# Patient Record
Sex: Male | Born: 1970 | Race: White | Hispanic: No | Marital: Married | Smoking: Current every day smoker
Health system: Southern US, Community
[De-identification: ages and names within clinical notes are randomized; demographics above are authoritative.]

## PROBLEM LIST (undated history)

## (undated) DIAGNOSIS — E109 Type 1 diabetes mellitus without complications: Secondary | ICD-10-CM

## (undated) DIAGNOSIS — R519 Headache, unspecified: Secondary | ICD-10-CM

## (undated) DIAGNOSIS — M25531 Pain in right wrist: Secondary | ICD-10-CM

## (undated) DIAGNOSIS — I10 Essential (primary) hypertension: Secondary | ICD-10-CM

## (undated) DIAGNOSIS — E785 Hyperlipidemia, unspecified: Secondary | ICD-10-CM

## (undated) DIAGNOSIS — M25511 Pain in right shoulder: Secondary | ICD-10-CM

## (undated) HISTORY — DX: Essential (primary) hypertension: I10

## (undated) HISTORY — PX: SHOULDER ARTHROSCOPY W/ ROTATOR CUFF REPAIR: SHX2400

## (undated) HISTORY — DX: Type 1 diabetes mellitus without complications: E10.9

## (undated) HISTORY — DX: Pain in right shoulder: M25.511

## (undated) HISTORY — DX: Pain in right wrist: M25.531

## (undated) HISTORY — DX: Headache, unspecified: R51.9

## (undated) HISTORY — DX: Hyperlipidemia, unspecified: E78.5

---

## 1997-08-22 ENCOUNTER — Emergency Department (HOSPITAL_COMMUNITY): Admission: EM | Admit: 1997-08-22 | Discharge: 1997-08-22 | Payer: Self-pay | Admitting: Emergency Medicine

## 2003-05-02 ENCOUNTER — Emergency Department (HOSPITAL_COMMUNITY): Admission: EM | Admit: 2003-05-02 | Discharge: 2003-05-02 | Payer: Self-pay | Admitting: Emergency Medicine

## 2005-05-11 IMAGING — CT CT ABDOMEN W/O CM
1 series · 16 of 32 positions shown, 20 images · IV contrast (agent unspecified)
Comparison: none

CLINICAL DATA: Right-sided flank pain.
 CT ABDOMEN WITHOUT CONTRAST AND CT PELVIS WITHOUT CONTRAST  - 05/02/03
 No prior studies for comparison.
 No IV or oral contrast was administered.
 CT ABDOMEN WITHOUT CONTRAST:
 Unenhanced appearance of the liver shows diffuse fatty infiltration.  The kidneys have a normal appearance bilaterally without hydronephrosis, renal calculi, or obvious contour abnormality on the unenhanced scan.  The ureters are normal.
 No other abnormality is identified.
 IMPRESSION
 Normal CT of the abdomen without contrast.  No acute findings.  Specifically no evidence of right-sided renal obstruction or calculi.  Incidental fatty infiltration of the liver.
 CT PELVIS WITHOUT CONTRAST:
 The ureters are normal without calculi.  The bladder is unremarkable.  There is a normal unenhanced appearance of the appendix.  No free fluid.
 Normal unenhanced CT of the pelvis.

[Series 3: — · axial · 0.79mm/px · z∈[+985,+1365]mm · 16 of 106 slices shown, 20 images]
[im 7/106  soft-tissue]
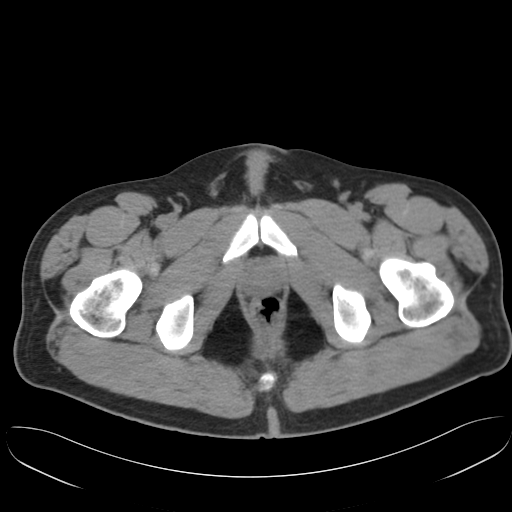
[im 7/106  bone]
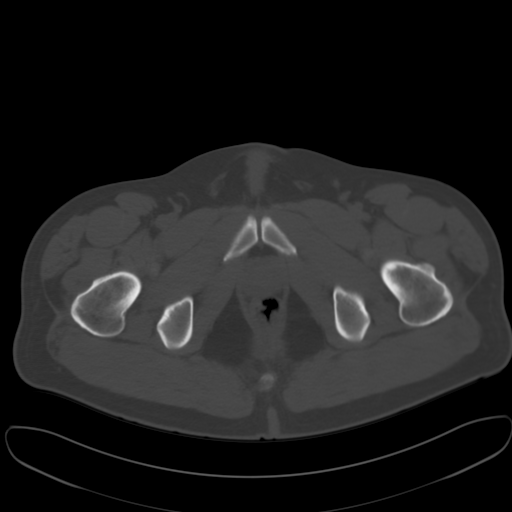
[im 14/106  soft-tissue]
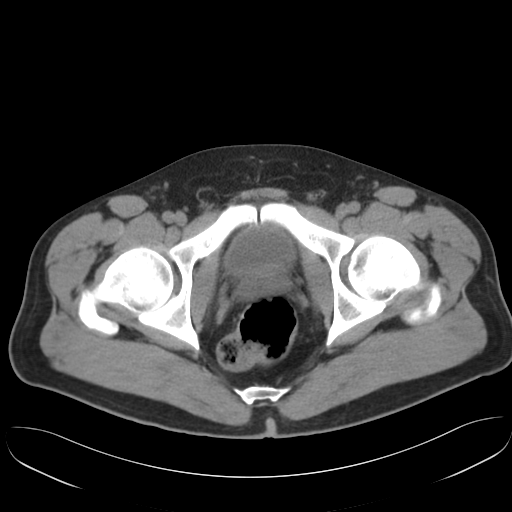
[im 21/106  soft-tissue]
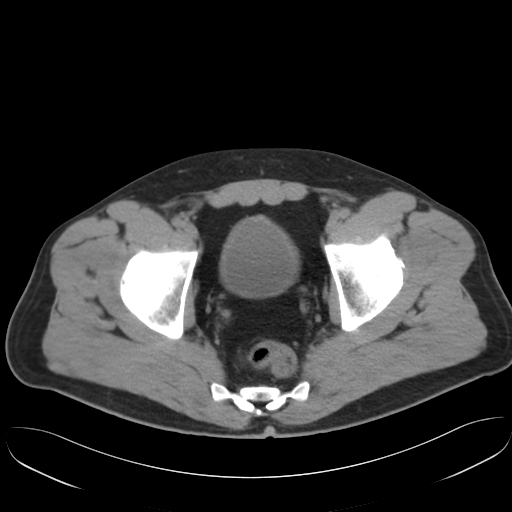
[im 28/106  soft-tissue]
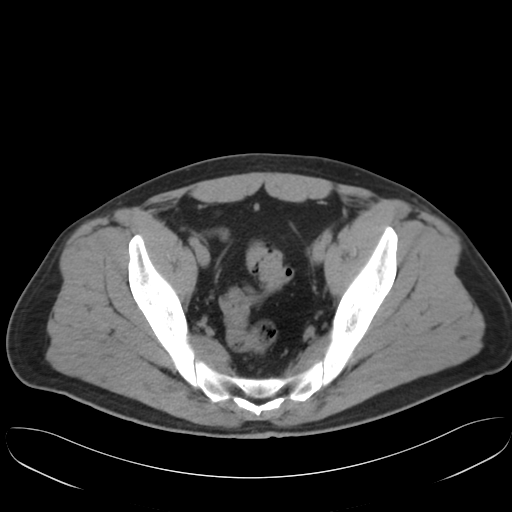
[im 34/106  soft-tissue]
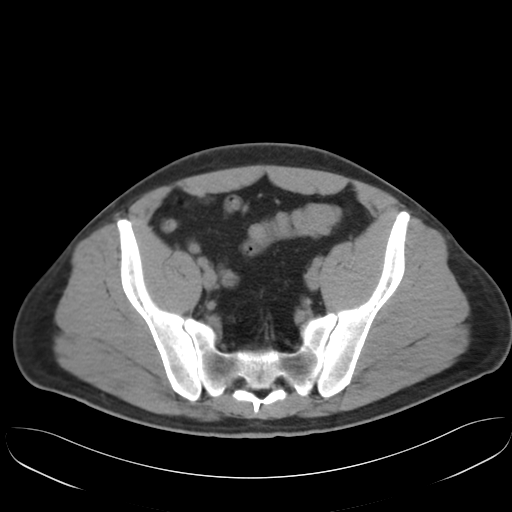
[im 41/106  soft-tissue]
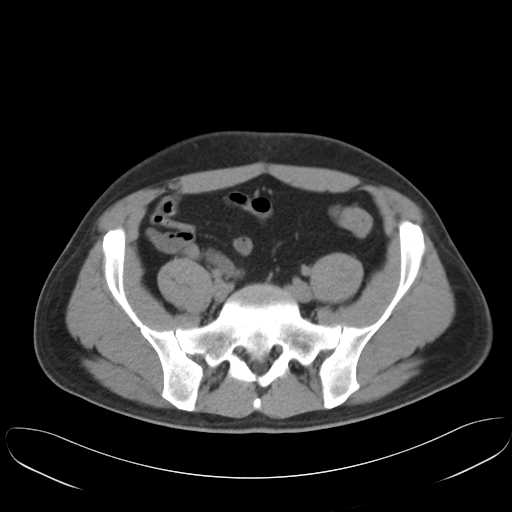
[im 48/106  soft-tissue]
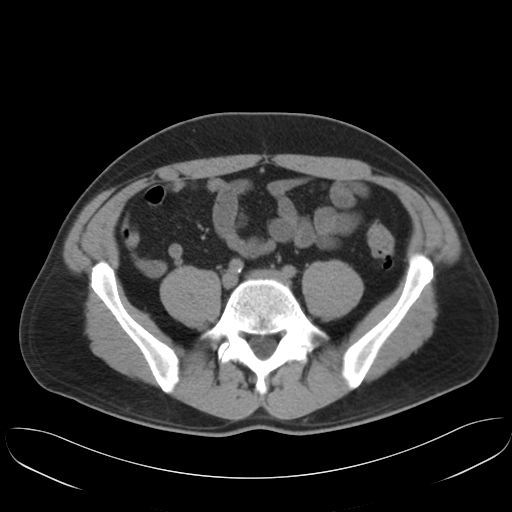
[im 58/106  soft-tissue]
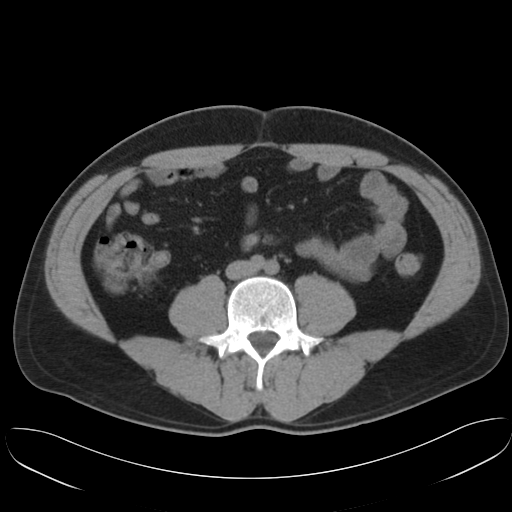
[im 65/106  soft-tissue]
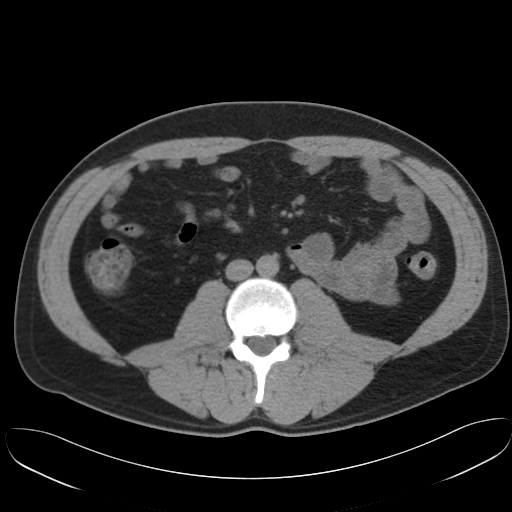
[im 65/106  bone]
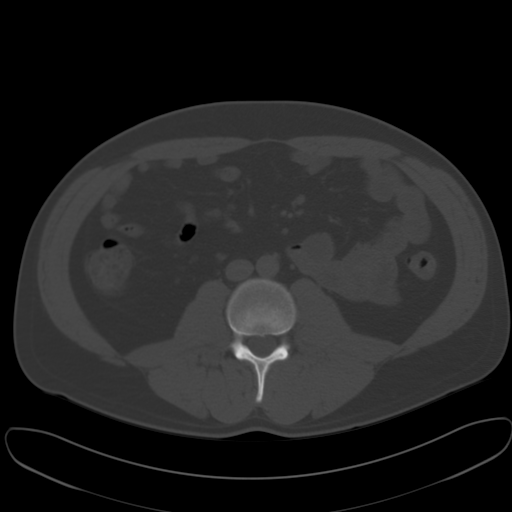
[im 72/106  soft-tissue]
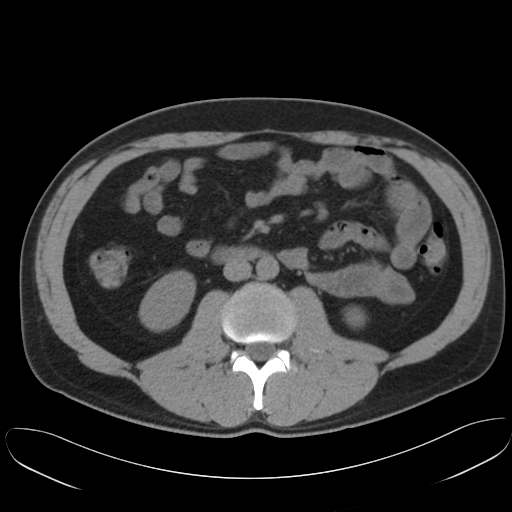
[im 78/106  soft-tissue]
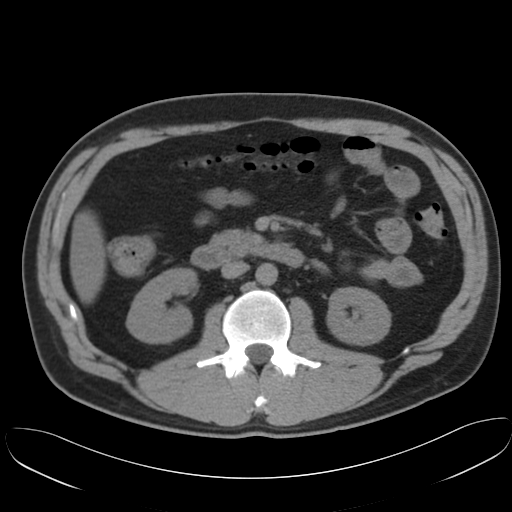
[im 85/106  soft-tissue]
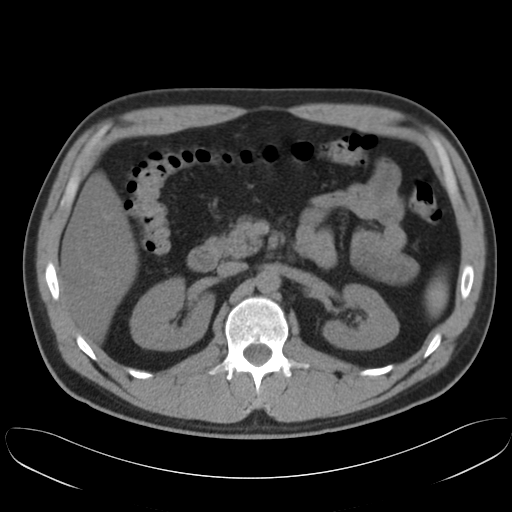
[im 92/106  soft-tissue]
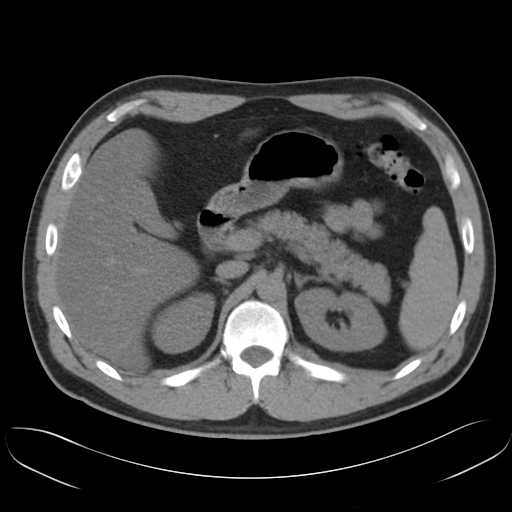
[im 92/106  lung]
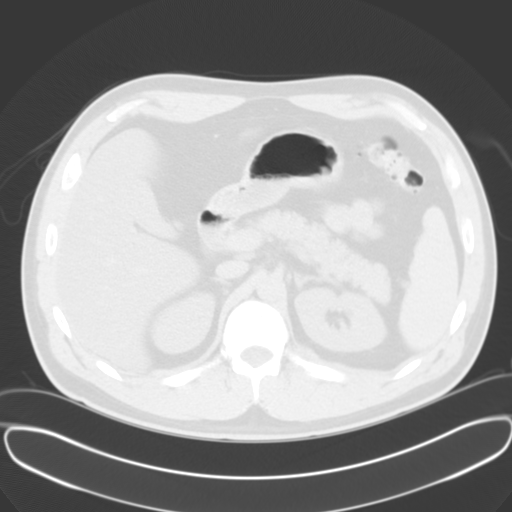
[im 95/106  lung]
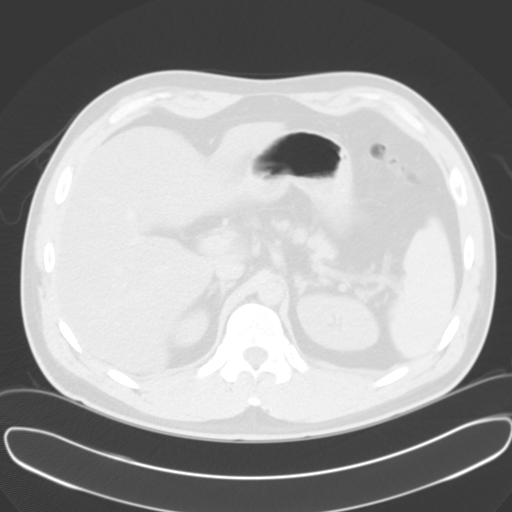
[im 99/106  soft-tissue]
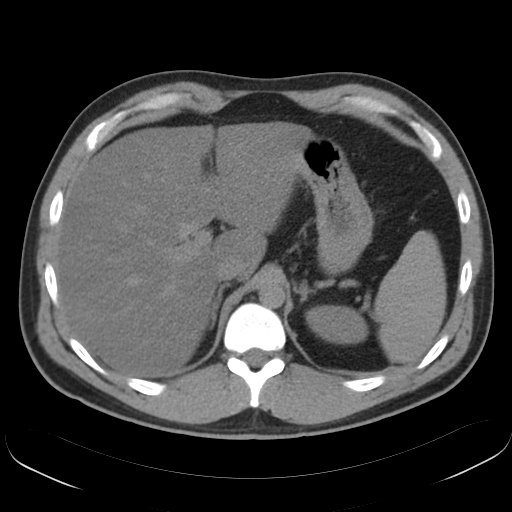
[im 99/106  lung]
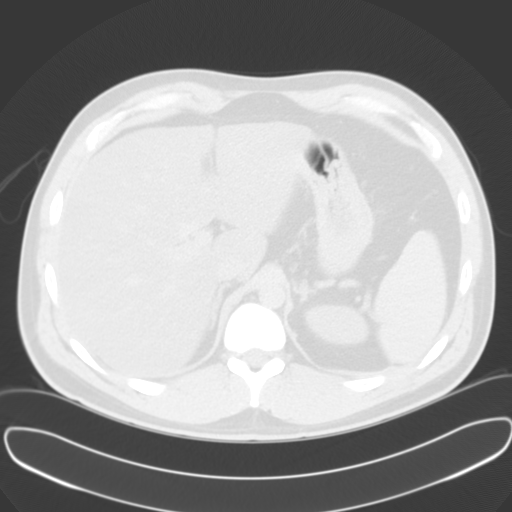
[im 102/106  lung]
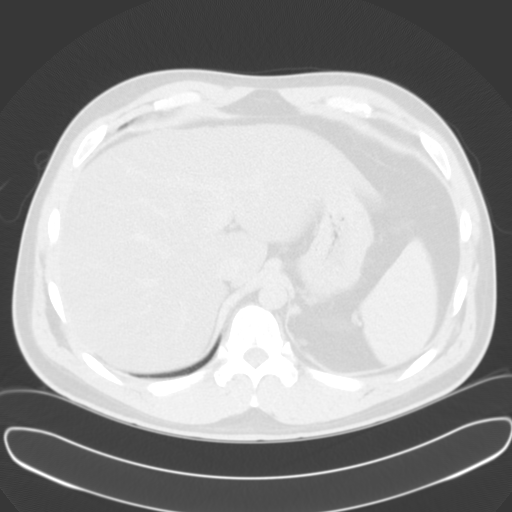

[16 of 32 positions shown; findings below may reference images not displayed]

## 2013-09-29 ENCOUNTER — Other Ambulatory Visit: Payer: Self-pay | Admitting: Neurosurgery

## 2013-09-29 DIAGNOSIS — M5416 Radiculopathy, lumbar region: Secondary | ICD-10-CM

## 2016-10-03 DIAGNOSIS — R21 Rash and other nonspecific skin eruption: Secondary | ICD-10-CM | POA: Diagnosis not present

## 2016-10-03 DIAGNOSIS — R51 Headache: Secondary | ICD-10-CM | POA: Diagnosis not present

## 2016-10-03 DIAGNOSIS — Z5181 Encounter for therapeutic drug level monitoring: Secondary | ICD-10-CM | POA: Diagnosis not present

## 2016-10-03 DIAGNOSIS — W57XXXA Bitten or stung by nonvenomous insect and other nonvenomous arthropods, initial encounter: Secondary | ICD-10-CM | POA: Diagnosis not present

## 2017-02-11 DIAGNOSIS — I10 Essential (primary) hypertension: Secondary | ICD-10-CM | POA: Diagnosis not present

## 2017-02-11 DIAGNOSIS — B370 Candidal stomatitis: Secondary | ICD-10-CM | POA: Diagnosis not present

## 2017-02-11 DIAGNOSIS — R7301 Impaired fasting glucose: Secondary | ICD-10-CM | POA: Diagnosis not present

## 2017-02-20 DIAGNOSIS — R799 Abnormal finding of blood chemistry, unspecified: Secondary | ICD-10-CM | POA: Diagnosis not present

## 2017-02-26 DIAGNOSIS — I10 Essential (primary) hypertension: Secondary | ICD-10-CM | POA: Diagnosis not present

## 2017-03-11 HISTORY — PX: OTHER SURGICAL HISTORY: SHX169

## 2017-05-01 DIAGNOSIS — I10 Essential (primary) hypertension: Secondary | ICD-10-CM | POA: Diagnosis not present

## 2017-05-01 DIAGNOSIS — E119 Type 2 diabetes mellitus without complications: Secondary | ICD-10-CM | POA: Diagnosis not present

## 2017-07-03 DIAGNOSIS — R1032 Left lower quadrant pain: Secondary | ICD-10-CM | POA: Diagnosis not present

## 2017-07-03 DIAGNOSIS — R10814 Left lower quadrant abdominal tenderness: Secondary | ICD-10-CM | POA: Diagnosis not present

## 2017-07-16 DIAGNOSIS — K5730 Diverticulosis of large intestine without perforation or abscess without bleeding: Secondary | ICD-10-CM | POA: Diagnosis not present

## 2017-07-16 DIAGNOSIS — R1032 Left lower quadrant pain: Secondary | ICD-10-CM | POA: Diagnosis not present

## 2017-07-16 DIAGNOSIS — K760 Fatty (change of) liver, not elsewhere classified: Secondary | ICD-10-CM | POA: Diagnosis not present

## 2017-07-25 ENCOUNTER — Encounter: Payer: Self-pay | Admitting: Physician Assistant

## 2017-07-30 ENCOUNTER — Ambulatory Visit: Payer: Self-pay | Admitting: Physician Assistant

## 2017-08-21 ENCOUNTER — Ambulatory Visit: Payer: Self-pay | Admitting: Physician Assistant

## 2018-03-11 DIAGNOSIS — N200 Calculus of kidney: Secondary | ICD-10-CM

## 2018-03-11 HISTORY — DX: Calculus of kidney: N20.0

## 2018-03-19 DIAGNOSIS — R109 Unspecified abdominal pain: Secondary | ICD-10-CM | POA: Diagnosis not present

## 2018-03-19 DIAGNOSIS — N200 Calculus of kidney: Secondary | ICD-10-CM | POA: Diagnosis not present

## 2018-03-19 DIAGNOSIS — K5730 Diverticulosis of large intestine without perforation or abscess without bleeding: Secondary | ICD-10-CM | POA: Diagnosis not present

## 2018-03-20 DIAGNOSIS — R1031 Right lower quadrant pain: Secondary | ICD-10-CM | POA: Diagnosis not present

## 2018-03-20 DIAGNOSIS — N401 Enlarged prostate with lower urinary tract symptoms: Secondary | ICD-10-CM | POA: Diagnosis not present

## 2018-03-20 DIAGNOSIS — N201 Calculus of ureter: Secondary | ICD-10-CM | POA: Diagnosis not present

## 2018-03-23 DIAGNOSIS — N201 Calculus of ureter: Secondary | ICD-10-CM | POA: Diagnosis not present

## 2018-03-23 DIAGNOSIS — R1031 Right lower quadrant pain: Secondary | ICD-10-CM | POA: Diagnosis not present

## 2018-03-23 DIAGNOSIS — N401 Enlarged prostate with lower urinary tract symptoms: Secondary | ICD-10-CM | POA: Diagnosis not present

## 2018-03-24 DIAGNOSIS — N401 Enlarged prostate with lower urinary tract symptoms: Secondary | ICD-10-CM | POA: Diagnosis not present

## 2018-03-24 DIAGNOSIS — E119 Type 2 diabetes mellitus without complications: Secondary | ICD-10-CM | POA: Diagnosis not present

## 2018-03-24 DIAGNOSIS — N201 Calculus of ureter: Secondary | ICD-10-CM | POA: Diagnosis not present

## 2018-03-27 DIAGNOSIS — N132 Hydronephrosis with renal and ureteral calculous obstruction: Secondary | ICD-10-CM | POA: Diagnosis not present

## 2018-03-27 DIAGNOSIS — N201 Calculus of ureter: Secondary | ICD-10-CM | POA: Diagnosis not present

## 2018-03-31 DIAGNOSIS — N401 Enlarged prostate with lower urinary tract symptoms: Secondary | ICD-10-CM | POA: Diagnosis not present

## 2018-03-31 DIAGNOSIS — N201 Calculus of ureter: Secondary | ICD-10-CM | POA: Diagnosis not present

## 2018-03-31 DIAGNOSIS — N200 Calculus of kidney: Secondary | ICD-10-CM | POA: Diagnosis not present

## 2018-03-31 DIAGNOSIS — N132 Hydronephrosis with renal and ureteral calculous obstruction: Secondary | ICD-10-CM | POA: Diagnosis not present

## 2018-04-03 DIAGNOSIS — I10 Essential (primary) hypertension: Secondary | ICD-10-CM | POA: Diagnosis not present

## 2018-04-03 DIAGNOSIS — E119 Type 2 diabetes mellitus without complications: Secondary | ICD-10-CM | POA: Diagnosis not present

## 2018-04-03 DIAGNOSIS — N200 Calculus of kidney: Secondary | ICD-10-CM | POA: Diagnosis not present

## 2018-04-10 DIAGNOSIS — N200 Calculus of kidney: Secondary | ICD-10-CM | POA: Diagnosis not present

## 2018-04-10 DIAGNOSIS — R1031 Right lower quadrant pain: Secondary | ICD-10-CM | POA: Diagnosis not present

## 2018-05-29 DIAGNOSIS — N200 Calculus of kidney: Secondary | ICD-10-CM | POA: Diagnosis not present

## 2019-07-15 ENCOUNTER — Telehealth: Payer: Self-pay

## 2019-07-15 NOTE — Telephone Encounter (Signed)
NOTES ON FILE FROM [facility] [phone], SENT REFERRAL TO SCHEDULING

## 2019-07-26 ENCOUNTER — Telehealth: Payer: Self-pay

## 2019-07-26 NOTE — Telephone Encounter (Signed)
NOTES ON FILE FROM  [facility] [phone], SENT REFERRAL TO SCHEDULING 

## 2019-07-27 ENCOUNTER — Encounter: Payer: Self-pay | Admitting: General Practice

## 2019-09-08 ENCOUNTER — Ambulatory Visit (INDEPENDENT_AMBULATORY_CARE_PROVIDER_SITE_OTHER): Payer: Self-pay | Admitting: Cardiology

## 2019-09-08 ENCOUNTER — Other Ambulatory Visit: Payer: Self-pay

## 2019-09-08 ENCOUNTER — Encounter: Payer: Self-pay | Admitting: Cardiology

## 2019-09-08 DIAGNOSIS — R0789 Other chest pain: Secondary | ICD-10-CM

## 2019-09-08 DIAGNOSIS — F17200 Nicotine dependence, unspecified, uncomplicated: Secondary | ICD-10-CM

## 2019-09-08 DIAGNOSIS — E785 Hyperlipidemia, unspecified: Secondary | ICD-10-CM

## 2019-09-08 DIAGNOSIS — I10 Essential (primary) hypertension: Secondary | ICD-10-CM | POA: Insufficient documentation

## 2019-09-08 LAB — LIPID PANEL
Chol/HDL Ratio: 5.8 ratio — ABNORMAL HIGH (ref 0.0–5.0)
Cholesterol, Total: 209 mg/dL — ABNORMAL HIGH (ref 100–199)
HDL: 36 mg/dL — ABNORMAL LOW (ref >39)
LDL Chol Calc (NIH): 117 mg/dL — ABNORMAL HIGH (ref 0–99)
Triglycerides: 322 mg/dL — ABNORMAL HIGH (ref 0–149)
VLDL Cholesterol Cal: 56 mg/dL — ABNORMAL HIGH (ref 5–40)

## 2019-09-08 MED ORDER — ASPIRIN EC 81 MG PO TBEC: 81.0000 mg | DELAYED_RELEASE_TABLET | Freq: Every day | ORAL | 3 refills | Status: DC

## 2019-09-08 NOTE — Progress Notes (Signed)
Cardiology Consultation:    Date:  09/08/2019   ID:  Blake Ball, DOB Aug 18, 1970, MRN [id number]  PCP:  Kendra Opitz, NP  Cardiologist:  Gypsy Balsam, MD   Referring MD: Kendra Opitz, NP   Chief Complaint  Patient presents with  . New Patient (Initial Visit)    Chest Pain     History of Present Illness:    Blake Ball is a 49 y.o. male who is being seen today for the evaluation of chest pain at the request of Kendra Opitz, NP.  In 2019 he sustained some shoulder and neck injury.  He did have surgery on his shoulder x2.  He came to [place] because he has been experiencing chest pain.  Chest pain can happen at different situations usually at rest typically sharp lasting for up to a minute.  He said that he sweats a lot all the time however he did notice some shortness of breath with the chest pain that he described.  He does not do any exercises on the regular basis.  He sometimes goes outside and do some work in the garden.  He does have few tomato plants that he attend.  Denies having any typical exertional chest pain.  He said this chest pain happening maybe every 1 or 2 weeks.  He graded this sensation 5-6 and scale up to 10.  When he have it there is no aggravating or relieving factor.  Interestingly pressing his chest wall I am able to partially reproduce this pain. Risk factors for coronary artery disease include smoking he smokes at least 1 pack/day his since he being 17. He does have borderline diabetes he said he does not have it anymore since he stopped eating sweets. He does have dyslipidemia, however last fasting lipid profile and K PN I have is from 2019. He does have family history of premature coronary artery disease and I have an honor of taking care of his family members. Does not exercise on a regular basis  Past Medical History:  Diagnosis Date  . Kidney stones 2020  . Right shoulder pain   . Right wrist pain     Past Surgical History:   Procedure Laterality Date  . SHOULDER ARTHROSCOPY W/ ROTATOR CUFF REPAIR  2018 & 2019    Current Medications: No outpatient medications have been marked as taking for the 09/08/19 encounter (Office Visit) with Georgeanna Lea, MD.     Allergies:   Patient has no known allergies.   Social History   Socioeconomic History  . Marital status: Single    Spouse name: Not on file  . Number of children: Not on file  . Years of education: Not on file  . Highest education level: Not on file  Occupational History  . Not on file  Tobacco Use  . Smoking status: Current Every Day Smoker    Packs/day: 1.00    Types: Cigarettes  . Smokeless tobacco: Never Used  Substance and Sexual Activity  . Alcohol use: Never  . Drug use: Never  . Sexual activity: Not on file  Other Topics Concern  . Not on file  Social History Narrative  . Not on file   Social Determinants of Health   Financial Resource Strain:   . Difficulty of Paying Living Expenses:   Food Insecurity:   . Worried About Programme researcher, broadcasting/film/video in the Last Year:   . [employer] of Food in the Last Year:  Transportation Needs:   . Freight forwarder (Medical):   Marland Kitchen Lack of Transportation (Non-Medical):   Physical Activity:   . Days of Exercise per Week:   . Minutes of Exercise per Session:   Stress:   . Feeling of Stress :   Social Connections:   . Frequency of Communication with Friends and Family:   . Frequency of Social Gatherings with Friends and Family:   . Attends Religious Services:   . Active Member of Clubs or Organizations:   . Attends Banker Meetings:   Marland Kitchen Marital Status:      Family History: The patient's family history includes Heart disease in his maternal grandfather. ROS:   Please see the history of present illness.    All 14 point review of systems negative except as described per history of present illness.  EKGs/Labs/Other Studies Reviewed:    The following studies were reviewed  today: EKG done today showed: Normal sinus rhythm, normal P interval, normal QS complex duration morphology, no ST segment changes    Recent Labs: No results found for requested labs within last 8760 hours.  Recent Lipid Panel No results found for: CHOL, TRIG, HDL, CHOLHDL, VLDL, LDLCALC, LDLDIRECT  Physical Exam:    VS:  BP (!) 132/100 (BP Location: Left Arm, Patient Position: Sitting, Cuff Size: Normal)   Pulse 89   Ht 6' (1.829 m)   Wt 244 lb (110.7 kg)   SpO2 97%   BMI 33.09 kg/m     Wt Readings from Last 3 Encounters:  09/08/19 244 lb (110.7 kg)     GEN:  Well nourished, well developed in no acute distress HEENT: Normal NECK: No JVD; No carotid bruits LYMPHATICS: No lymphadenopathy CARDIAC: RRR, no murmurs, no rubs, no gallops RESPIRATORY:  Clear to auscultation without rales, wheezing or rhonchi  ABDOMEN: Soft, non-tender, non-distended MUSCULOSKELETAL:  No edema; No deformity  SKIN: Warm and dry NEUROLOGIC:  Alert and oriented x 3 PSYCHIATRIC:  Normal affect   ASSESSMENT:    1. Atypical chest pain   2. Dyslipidemia   3. Smoking   4. Essential hypertension    PLAN:    In order of problems listed above:  1. Atypical chest pain in this gentleman with multiple risk factors for coronary artery disease.  We did talk about options for this situation my opinion the best approach will be to pursue stress testing.  However he does not have any insurance and he does not want to pay that much for the test.  Therefore we were talking about alternative way to assess the situation.  We decided to pursue calcium score.  The price of this test is only $99, he will be able to afford it if calcium score is negative chances for him to have significant coronary artery disease and much less.  On top of that his pain gets some atypical characteristic including the fact that when I press chest wall the pain is partially reproduced by doing it.  I asked him to start taking 1 baby aspirin  every single day.  I will not give him any antianginal medication since I have a low level suspicion for active problem right now. 2. Dyslipidemia: We will check his fasting lipid profile today. 3. Smoking: We had a long discussion about this and I strongly recommended him to quit.  He understands a problem and he is trying to work on that. 4. Essential hypertension he described the fact that his blood pressure fluctuates a  lot.  His EKG did not show any left ventricle hypertrophy.  I offered him echocardiogram to reassess precisely if there is any left ventricle hypertrophy.  He does not want to do it because it is an expensive test.  I asked him to check blood pressure on the regular basis and bring it to me next time when I see him.   Medication Adjustments/Labs and Tests Ordered: Current medicines are reviewed at length with the patient today.  Concerns regarding medicines are outlined above.  No orders of the defined types were placed in this encounter.  No orders of the defined types were placed in this encounter.   Signed, Georgeanna Lea, MD, [facility]. 09/08/2019 9:38 AM    [facility]

## 2019-09-08 NOTE — Patient Instructions (Signed)
Medication Instructions:  Your physician has recommended you make the following change in your medication:   START: Aspirin 81 mg daily   *If you need a refill on your cardiac medications before your next appointment, please call your pharmacy*   Lab Work: Your physician recommends that you return for lab work today: lipid   If you have labs (blood work) drawn today and your tests are completely normal, you will receive your results only by: Marland Kitchen MyChart Message (if you have MyChart) OR . A paper copy in the mail If you have any lab test that is abnormal or we need to change your treatment, we will call you to review the results.   Testing/Procedures: Non-Cardiac CT scanning, (CAT scanning), is a noninvasive, special x-ray that produces cross-sectional images of the body using x-rays and a computer. CT scans help physicians diagnose and treat medical conditions. For some CT exams, a contrast material is used to enhance visibility in the area of the body being studied. CT scans provide greater clarity and reveal more details than regular x-ray exams.     Follow-Up: At [facility], you and your health needs are our priority.  As part of our continuing mission to provide you with exceptional heart care, we have created designated Provider Care Teams.  These Care Teams include your primary Cardiologist (physician) and Advanced Practice Providers (APPs -  Physician Assistants and Nurse Practitioners) who all work together to provide you with the care you need, when you need it.  We recommend signing up for the patient portal called "MyChart".  Sign up information is provided on this After Visit Summary.  MyChart is used to connect with patients for Virtual Visits (Telemedicine).  Patients are able to view lab/test results, encounter notes, upcoming appointments, etc.  Non-urgent messages can be sent to your provider as well.   To learn more about what you can do with MyChart, go to  [email or web address].    Your next appointment:   6 week(s)  The format for your next appointment:   In Person  Provider:   Gypsy Balsam, MD   Other Instructions   Aspirin and Your Heart  Aspirin is a medicine that prevents the cells in the blood that are used for clotting, called platelets, from sticking together. Aspirin can be used to help reduce the risk of blood clots, heart attacks, and other heart-related problems. Can I take aspirin? Your health care provider will help you determine whether it is safe and beneficial for you to take aspirin daily. Taking aspirin daily may be helpful if you:  Have had a heart attack or chest pain.  Are at risk for a heart attack.  Have undergone open-heart surgery, such as coronary artery bypass surgery (CABG).  Have had coronary angioplasty or a stent.  Have had certain types of stroke or transient ischemic attack (TIA).  Have peripheral artery disease (PAD).  Have chronic heart rhythm problems such as atrial fibrillation and cannot take an anticoagulant.  Have valve disease or have had surgery on a valve. What are the risks? Daily use of aspirin can cause side effects. Some of these include:  Bleeding. Bleeding problems can be minor or serious. An example of a minor problem is a cut that does not stop bleeding. An example of a more serious problem is stomach bleeding or, rarely, bleeding into the brain. Your risk of bleeding is increased if you are also taking non-steroidal anti-inflammatory drugs (NSAIDs).  Increased bruising.  Upset stomach.  An allergic reaction. People who have nasal polyps have an increased risk of developing an aspirin allergy. General guidelines  Take aspirin only as told by your health care provider. Make sure that you understand how much you should take and what form you should take. The two forms of aspirin are: ? Non-enteric-coated.This type of aspirin does not have a coating and is absorbed  quickly. This type of aspirin also comes in a chewable form. ? Enteric-coated. This type of aspirin has a coating that releases the medicine very slowly. Enteric-coated aspirin might cause less stomach upset than non-enteric-coated aspirin. This type of aspirin should not be chewed or crushed.  Limit alcohol intake to no more than 1 drink a day for nonpregnant women and 2 drinks a day for men. Drinking alcohol increases your risk of bleeding. One drink equals 12 oz of beer, 5 oz of wine, or 1 oz of hard liquor. Contact a health care provider if you:  Have unusual bleeding or bruising.  Have stomach pain or nausea.  Have ringing in your ears.  Have an allergic reaction that causes: ? Hives. ? Itchy skin. ? Swelling of the lips, tongue, or face. Get help right away if you:  Notice that your bowel movements are bloody, dark red, or black in color.  Vomit or cough up blood.  Have blood in your urine.  Cough, have noisy breathing (wheeze), or feel short of breath.  Have chest pain, especially if the pain spreads to the arms, back, neck, or jaw.  Have a severe headache, or a headache with confusion, or dizziness. These symptoms may represent a serious problem that is an emergency. Do not wait to see if the symptoms will go away. Get medical help right away. Call your local emergency services (911 in the [place] Do not drive yourself to the hospital. Summary  Aspirin can be used to help reduce the risk of blood clots, heart attacks, and other heart-related problems.  Daily use of aspirin can increase your risk of side effects. Your health care provider will help you determine whether it is safe and beneficial for you to take aspirin daily.  Take aspirin only as told by your health care provider. Make sure that you understand how much you can take and what form you can take. This information is not intended to replace advice given to you by your health care provider. Make sure you  discuss any questions you have with your health care provider. Document Revised: 12/26/2016 Document Reviewed: 12/26/2016 Elsevier Patient Education  2020 [employer].

## 2019-09-21 ENCOUNTER — Inpatient Hospital Stay: Admission: RE | Admit: 2019-09-21 | Payer: Self-pay | Source: Ambulatory Visit

## 2019-10-25 ENCOUNTER — Ambulatory Visit: Payer: Self-pay | Admitting: Cardiology

## 2023-05-30 ENCOUNTER — Ambulatory Visit: Payer: Medicaid Other | Admitting: Diagnostic Neuroimaging

## 2023-05-30 ENCOUNTER — Encounter: Payer: Self-pay | Admitting: Diagnostic Neuroimaging

## 2023-05-30 VITALS — BP 139/75 | HR 84 | Ht 72.0 in | Wt 234.0 lb

## 2023-05-30 DIAGNOSIS — M79601 Pain in right arm: Secondary | ICD-10-CM

## 2023-05-30 DIAGNOSIS — M62838 Other muscle spasm: Secondary | ICD-10-CM

## 2023-05-30 DIAGNOSIS — M542 Cervicalgia: Secondary | ICD-10-CM

## 2023-05-30 DIAGNOSIS — G4486 Cervicogenic headache: Secondary | ICD-10-CM | POA: Diagnosis not present

## 2023-05-30 NOTE — Patient Instructions (Signed)
  RIGHT NECK PAIN -> radiating to the right arm (since ~2019 injury; slipped on roof) - request prior orthopedic records (right shoulder surgery x 2; ~2018, 2019) and prior EMG/NCS testing (s/p right carpal tunnel release surgery in 2019) - likely related to prior trauma and right cervical radiculopathy; no evidence of ulnar neuropathy on neuro exam - right cervical epidural injection helped for a few days, but then wore off - continue lyrica; continue PT / OT exercises - follow up with Dr. Jake Samples re: surgical treatment options  CERVICOGENIC HEADACHES + migraine headaches - some throbbing headaches, blurred vision, ringing in ears - continue pain mgmt  MUSCLE SPASMS / "SHAKING" / TREMORS (triggered by pain) - continue pain mgmt

## 2023-05-30 NOTE — Progress Notes (Signed)
 [facility]  PATIENT: Blake Ball DOB: 1970-11-27  REFERRING CLINICIAN: Dawley, Alan Mulder, DO HISTORY FROM: PATIENT  REASON FOR VISIT: NEW CONSULT   HISTORICAL  CHIEF COMPLAINT:  Chief Complaint  Patient presents with   Rm 7    Patient is here with his wife Misty Stanley for new referral for R ulnar neuropathy. Patient states that 5 years ago he had shoulder surgery 2x and he has had trouble with his arm since then. He feels pins, cold, pain. Most of his fingers are numb most of the time. Some days it feels like a tooth ache. He has had carpal tunnel in R wrist as well and had it repaired. He also states he has neck pain and a headache nearly everyday. He gets "the shakes" in both arms and it exacerbates his symptoms.    HISTORY OF PRESENT ILLNESS:   53 year old male here for evaluation of right arm pain.  Around 2019 patient was working on a roof when he slipped and had to grab a hose to prevent him from falling off the roof.  He felt a pop sensation in his right shoulder.  He had significant pain in his right shoulder, right arm, right neck.  He went to orthopedic clinic and had right shoulder rotator cuff surgery.  He had a second surgery this time of the right shoulder and for right carpal tunnel syndrome, after an EMG nerve conduction study was done.  Symptoms have continued since that time.  Patient continues to have ongoing pain in his right shoulder, right arm, radiating to his right neck, right head, as well as numbness and tingling in his right hand, with all fingertips involved.  Patient went to neurosurgery clinic for evaluation.  They sent him here to be evaluated for possible right ulnar neuropathy and electrical testing.   REVIEW OF SYSTEMS: Full 14 system review of systems performed and negative with exception of: as per HPI.  ALLERGIES: No Known Allergies  HOME MEDICATIONS: Outpatient Medications Prior to Visit  Medication Sig Dispense Refill    cyclobenzaprine (FLEXERIL) 10 MG tablet Take 10 mg by mouth 3 (three) times daily. As needed     metFORMIN (GLUCOPHAGE) 500 MG tablet Take 500 mg by mouth 2 (two) times daily.     pregabalin (LYRICA) 25 MG capsule Take 25 mg by mouth 3 (three) times daily. As needed     rosuvastatin (CRESTOR) 10 MG tablet Take 10 mg by mouth daily.     valsartan-hydrochlorothiazide (DIOVAN-HCT) 160-12.5 MG tablet Take 1 tablet by mouth daily.     aspirin EC 81 MG tablet Take 1 tablet (81 mg total) by mouth daily. Swallow whole. (Patient not taking: Reported on 05/30/2023) 90 tablet 3   No facility-administered medications prior to visit.    PAST MEDICAL HISTORY: Past Medical History:  Diagnosis Date   Diabetes mellitus type 1 (HCC)    Headache    Hyperlipidemia    Hypertension    Kidney stones 2020   Right shoulder pain    Right wrist pain     PAST SURGICAL HISTORY: Past Surgical History:  Procedure Laterality Date   carpal tunnel repair Right 2019   SHOULDER ARTHROSCOPY W/ ROTATOR CUFF REPAIR  2018 & 2019    FAMILY HISTORY: Family History  Problem Relation Age of Onset   Heart disease Maternal Grandfather        Had open heart surgery    Carpal tunnel syndrome Neg Hx     SOCIAL  HISTORY: Social History   Socioeconomic History   Marital status: Married    Spouse name: Not on file   Number of children: 0   Years of education: Not on file   Highest education level: High school graduate  Occupational History   Not on file  Tobacco Use   Smoking status: Every Day    Current packs/day: 1.00    Types: Cigarettes   Smokeless tobacco: Never  Vaping Use   Vaping status: Never Used  Substance and Sexual Activity   Alcohol use: Never   Drug use: Never   Sexual activity: Not on file  Other Topics Concern   Not on file  Social History Narrative   Right handed   Lives at home with wife   Caffeine: 3 cups coffee/day   Social Drivers of Corporate investment banker Strain: Not on file   Food Insecurity: Not on file  Transportation Needs: Not on file  Physical Activity: Not on file  Stress: Not on file  Social Connections: Not on file  Intimate Partner Violence: Not on file     PHYSICAL EXAM  GENERAL EXAM/CONSTITUTIONAL: Vitals:  Vitals:   05/30/23 0825  BP: 139/75  Pulse: 84  Weight: 234 lb (106.1 kg)  Height: 6' (1.829 m)   Body mass index is 31.74 kg/m. Wt Readings from Last 3 Encounters:  05/30/23 234 lb (106.1 kg)  09/08/19 244 lb (110.7 kg)   Patient is in no distress; well developed, nourished and groomed; neck is supple  CARDIOVASCULAR: Examination of carotid arteries is normal; no carotid bruits Regular rate and rhythm, no murmurs Examination of peripheral vascular system by observation and palpation is normal  EYES: Ophthalmoscopic exam of optic discs and posterior segments is normal; no papilledema or hemorrhages No results found.  MUSCULOSKELETAL: Gait, strength, tone, movements noted in Neurologic exam below  NEUROLOGIC: MENTAL STATUS:      No data to display         awake, alert, oriented to person, place and time recent and remote memory intact normal attention and concentration language fluent, comprehension intact, naming intact fund of knowledge appropriate  CRANIAL NERVE:  2nd - no papilledema on fundoscopic exam 2nd, 3rd, 4th, 6th - pupils equal and reactive to light, visual fields full to confrontation, extraocular muscles intact, no nystagmus 5th - facial sensation symmetric 7th - facial strength symmetric 8th - hearing intact 9th - palate elevates symmetrically, uvula midline 11th - shoulder shrug symmetric 12th - tongue protrusion midline  MOTOR:  normal bulk and tone, full strength in the BUE, BLE  SENSORY:  normal and symmetric to light touch, pinprick, temperature, vibration  COORDINATION:  finger-nose-finger, fine finger movements normal  REFLEXES:  deep tendon reflexes TRAUMA and  symmetric  GAIT/STATION:  narrow based gait     DIAGNOSTIC DATA (LABS, IMAGING, TESTING) - I reviewed patient records, labs, notes, testing and imaging myself where available.  No results found for: "WBC", "HGB", "HCT", "MCV", "PLT" No results found for: "NA", "K", "CL", "CO2", "GLUCOSE", "BUN", "CREATININE", "CALCIUM", "PROT", "ALBUMIN", "AST", "ALT", "ALKPHOS", "BILITOT", "GFRNONAA", "GFRAA" Lab Results  Component Value Date   CHOL 209 (H) 09/08/2019   HDL 36 (L) 09/08/2019   LDLCALC 117 (H) 09/08/2019   TRIG 322 (H) 09/08/2019   CHOLHDL 5.8 (H) 09/08/2019   No results found for: "HGBA1C" No results found for: "VITAMINB12" No results found for: "TSH"     ASSESSMENT AND PLAN  53 y.o. year old male here with:  Dx:  1. Right arm pain   2. Neck pain on right side   3. Cervicogenic headache   4. Muscle spasm     PLAN:  RIGHT NECK PAIN -> radiating to the right arm (since ~2019 injury; slipped on roof) - will request prior orthopedic records (right shoulder surgery x 2; ~2018, 2019) and prior EMG/NCS testing (s/p right carpal tunnel release surgery in 2019) - likely related to prior trauma and right cervical radiculopathy; no evidence of ulnar neuropathy on neuro exam (hold off on EMG/NCS until we receive prior records) - right cervical epidural injection helped for a few days, but then wore off - continue lyrica; continue PT / OT exercises - follow up with Dr. Jake Samples re: surgical treatment options  CERVICOGENIC HEADACHES + migraine headaches - some throbbing headaches, blurred vision, ringing in ears - continue pain mgmt  MUSCLE SPASMS / "SHAKING" / TREMORS (triggered by pain) - continue pain mgmt  Return for pending if symptoms worsen or fail to improve, return to referring provider.    Suanne Marker, MD 05/30/2023, 12:27 PM Certified in Neurology, Neurophysiology and Neuroimaging  [facility] [address], [address] [place],  [place] [address] [phone]

## 2023-09-02 ENCOUNTER — Other Ambulatory Visit: Payer: Self-pay

## 2023-09-02 DIAGNOSIS — R202 Paresthesia of skin: Secondary | ICD-10-CM

## 2023-09-04 ENCOUNTER — Ambulatory Visit: Admitting: Neurology

## 2023-09-04 DIAGNOSIS — R202 Paresthesia of skin: Secondary | ICD-10-CM

## 2023-09-04 DIAGNOSIS — G5602 Carpal tunnel syndrome, left upper limb: Secondary | ICD-10-CM

## 2023-09-04 DIAGNOSIS — G5623 Lesion of ulnar nerve, bilateral upper limbs: Secondary | ICD-10-CM

## 2023-09-04 NOTE — Procedures (Signed)
 [facility]  [address] [place], [address]  [place], [place] [address] Tel: [phone] Fax: [phone] Test Date:  09/04/2023  Patient: Blake Ball DOB: May 27, 1970 Physician: Tonita Blanch, DO  Sex: Male Height: 6' 0 Ref Phys: Dorn Ned, MD  ID#: [id number]   Technician:    History: This is a 53 year old man referred for evaluation of bilateral hand paresthesias, worse on the right.  NCV & EMG Findings: Extensive electrodiagnostic testing of the right upper extremity and additional studies of the left shows:  Bilateral median and right mixed palmar sensory responses are within normal limits.  Left mixed palmar sensory response shows prolonged latency.  Bilateral ulnar sensory responses show prolonged latency (R3.4, L3.2 ms).   Right median motor response is within normal limits.  Left median motor response shows prolonged latency (4.4 ms).  Bilateral ulnar motor responses show slowed conduction velocity across the elbow (A Elbow-B Elbow, R45, L44 m/s), with prolonged latency on the right (R3.2 ms). There is no evidence of active or chronic motor axonal loss changes affecting any of the tested muscles.  Motor unit configuration and recruitment pattern is within normal limits.  Impression: Bilateral ulnar neuropathy with slowing across the elbow, demyelinating, mild. Left median neuropathy at or distal to the wrist, consistent with a clinical diagnosis of carpal tunnel syndrome.  Overall, these findings are very mild in degree electrically.   ___________________________ Tonita Blanch, DO    Nerve Conduction Studies   Stim Site NR Peak (ms) Norm Peak (ms) O-P Amp (V) Norm O-P Amp  Left Median Anti Sensory (2nd Digit)  32 C  Wrist    3.6 <3.6 18.7 >15  Right Median Anti Sensory (2nd Digit)  32 C  Wrist    3.5 <3.6 18.5 >15  Left Ulnar Anti Sensory (5th Digit)  32 C  Wrist    *3.2 <3.1 12.4 >10  Right Ulnar Anti Sensory (5th Digit)  32 C  Wrist    *3.4  <3.1 13.7 >10     Stim Site NR Onset (ms) Norm Onset (ms) O-P Amp (mV) Norm O-P Amp Site1 Site2 Delta-0 (ms) Dist (cm) Vel (m/s) Norm Vel (m/s)  Left Median Motor (Abd Poll Brev)  32 C  Wrist    *4.4 <4.0 8.1 >6 Elbow Wrist 6.1 32.0 52 >50  Elbow    10.5  7.6         Right Median Motor (Abd Poll Brev)  32 C  Wrist    4.0 <4.0 8.6 >6 Elbow Wrist 5.9 33.0 56 >50  Elbow    9.9  8.5         Left Ulnar Motor (Abd Dig Minimi)  32 C  Wrist    2.9 <3.1 11.7 >7 B Elbow Wrist 5.0 25.0 50 >50  B Elbow    7.9  10.7  A Elbow B Elbow 2.5 11.0 *44 >50  A Elbow    10.4  10.7         Right Ulnar Motor (Abd Dig Minimi)  32 C  Wrist    *3.2 <3.1 11.4 >7 B Elbow Wrist 4.7 26.0 55 >50  B Elbow    7.9  10.4  A Elbow B Elbow 2.2 10.0 *45 >50  A Elbow    10.1  8.6            Stim Site NR Peak (ms) Norm Peak (ms) P-T Amp (V) Site1 Site2 Delta-P (ms) Norm Delta (ms)  Left Median/Ulnar Palm Comparison (Wrist -  8cm)  32 C  Median Palm    *2.4 <2.2 16.9 Median Palm Ulnar Palm *0.6   Ulnar Palm    1.8 <2.2 8.5      Right Median/Ulnar Palm Comparison (Wrist - 8cm)  32 C  Median Palm    2.0 <2.2 29.4 Median Palm Ulnar Palm 0.2   Ulnar Palm    1.8 <2.2 10.2       Electromyography   Side Muscle Ins.Act Fibs Fasc Recrt Amp Dur Poly Activation Comment  Right 1stDorInt Nml Nml Nml Nml Nml Nml Nml Nml N/A  Right PronatorTeres Nml Nml Nml Nml Nml Nml Nml Nml N/A  Right Biceps Nml Nml Nml Nml Nml Nml Nml Nml N/A  Right Triceps Nml Nml Nml Nml Nml Nml Nml Nml N/A  Right Deltoid Nml Nml Nml Nml Nml Nml Nml Nml N/A  Left 1stDorInt Nml Nml Nml Nml Nml Nml Nml Nml N/A  Left Abd Poll Brev Nml Nml Nml Nml Nml Nml Nml Nml N/A  Left PronatorTeres Nml Nml Nml Nml Nml Nml Nml Nml N/A  Left Biceps Nml Nml Nml Nml Nml Nml Nml Nml N/A  Left Triceps Nml Nml Nml Nml Nml Nml Nml Nml N/A  Left Deltoid Nml Nml Nml Nml Nml Nml Nml Nml N/A  Left Abd Dig Min Nml Nml Nml Nml Nml Nml Nml Nml N/A  Left FlexCarpiUln Nml Nml Nml Nml  Nml Nml Nml Nml N/A  Right Abd Dig Min Nml Nml Nml Nml Nml Nml Nml Nml N/A  Right FlexCarpiUln Nml Nml Nml Nml Nml Nml Nml Nml N/A      Waveforms:
